# Patient Record
Sex: Male | Born: 1995 | Hispanic: Yes | Marital: Single | Smoking: Never smoker
Health system: Southern US, Community
[De-identification: ages and names within clinical notes are randomized; demographics above are authoritative.]

---

## 2017-07-24 ENCOUNTER — Emergency Department (HOSPITAL_COMMUNITY)
Admission: EM | Admit: 2017-07-24 | Discharge: 2017-07-24 | Disposition: A | Discharge status: Home / Self Care | Payer: Worker's Compensation | Attending: Emergency Medicine | Admitting: Emergency Medicine

## 2017-07-24 ENCOUNTER — Encounter (HOSPITAL_COMMUNITY): Payer: Self-pay | Admitting: Emergency Medicine

## 2017-07-24 DIAGNOSIS — Y9289 Other specified places as the place of occurrence of the external cause: Secondary | ICD-10-CM | POA: Insufficient documentation

## 2017-07-24 DIAGNOSIS — Y990 Civilian activity done for income or pay: Secondary | ICD-10-CM | POA: Insufficient documentation

## 2017-07-24 DIAGNOSIS — W291XXA Contact with electric knife, initial encounter: Secondary | ICD-10-CM | POA: Diagnosis not present

## 2017-07-24 DIAGNOSIS — Z23 Encounter for immunization: Secondary | ICD-10-CM | POA: Diagnosis not present

## 2017-07-24 DIAGNOSIS — S61011A Laceration without foreign body of right thumb without damage to nail, initial encounter: Secondary | ICD-10-CM

## 2017-07-24 DIAGNOSIS — Y9389 Activity, other specified: Secondary | ICD-10-CM | POA: Diagnosis not present

## 2017-07-24 MED ORDER — LIDOCAINE HCL (PF) 1 % IJ SOLN
5.0000 mL | Freq: Once | INTRAMUSCULAR | Status: AC
  Administered 2017-07-24: 5 mL
  Filled 2017-07-24: qty 30

## 2017-07-24 MED ORDER — TETANUS-DIPHTH-ACELL PERTUSSIS 5-2.5-18.5 LF-MCG/0.5 IM SUSP
0.5000 mL | Freq: Once | INTRAMUSCULAR | Status: AC
  Administered 2017-07-24: 0.5 mL via INTRAMUSCULAR
  Filled 2017-07-24: qty 0.5

## 2017-07-24 NOTE — ED Notes (Signed)
Patient verbalized understanding of discharge instructions, no questions. Patient ambulated out of ED with steady gait in no distress.  

## 2017-07-24 NOTE — ED Provider Notes (Signed)
[facility]-EMERGENCY DEPT Provider Note   CSN: [id number] Arrival date & time: 07/24/17  1829     History   Chief Complaint Chief Complaint  Patient presents with  . Laceration    HPI Dylan Cuevas is a 22 y.o. male who presents to the ED for a laceration. 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 Laceration to right thumb. Bleeding controlled. Unknown last tetanus.   HPI  History reviewed. No pertinent past medical history.  There are no active problems to display for this patient.   History reviewed. No pertinent surgical history.      Home Medications    Prior to Admission medications   Not on File    Family History No family history on file.  Social History Social History   Tobacco Use  . Smoking status: Not on file  Substance Use Topics  . Alcohol use: Not on file  . Drug use: Not on file     Allergies   Amoxicillin   Review of Systems Review of Systems  Skin: Positive for wound.  All other systems reviewed and are negative.    Physical Exam Updated Vital Signs BP (!) 164/104 (BP Location: Right Arm)   Pulse 79   Temp 99.7 F (37.6 C) (Oral)   Resp 20   SpO2 98%   Physical Exam  Constitutional: He appears well-developed and well-nourished. No distress.  HENT:  Head: Normocephalic.  Eyes: EOM are normal.  Neck: Neck supple.  Cardiovascular: Normal rate.  Pulmonary/Chest: Effort normal.  Musculoskeletal:       Right hand: He exhibits laceration.       Hands: 3 cm Flap laceration to the distal aspect of the right thumb.  Neurological: He is alert.  Skin: Skin is warm and dry.  Psychiatric: He has a normal mood and affect.  Nursing note and vitals reviewed.    ED Treatments / Results  Labs (all labs ordered are listed, but only abnormal results are displayed) Labs Reviewed - No data to display  Radiology No results found.  Procedures .Marland Kitchen.Laceration Repair Date/Time: 07/24/2017  7:50 PM Performed by: Janne NapoleonNeese, Nealie Mchatton M, NP Authorized by: Janne NapoleonNeese, Casmir Auguste M, NP   Consent:    Consent obtained:  Verbal   Consent given by:  Patient   Risks discussed:  Pain and poor cosmetic result Anesthesia (see MAR for exact dosages):    Anesthesia method:  Local infiltration   Local anesthetic:  Lidocaine 1% w/o epi Laceration details:    Location:  Finger   Finger location:  R index finger   Length (cm):  3 Repair type:    Repair type:  Simple Pre-procedure details:    Preparation:  Patient was prepped and draped in usual sterile fashion and imaging obtained to evaluate for foreign bodies Exploration:    Hemostasis achieved with:  Direct pressure   Wound exploration: wound explored through full range of motion     Contaminated: no   Treatment:    Area cleansed with:  Saline and Betadine   Irrigation solution:  Sterile saline   Irrigation method:  Syringe Skin repair:    Repair method:  Sutures   Suture size:  4-0   Suture material:  Prolene   Suture technique:  Simple interrupted   Number of sutures:  6 Approximation:    Approximation:  Close Post-procedure details:    Dressing:  Non-adherent dressing   Patient tolerance of procedure:  Tolerated well, no immediate complications Comments:  Tetanus updated   (including critical care time)  Medications Ordered in ED Medications  lidocaine (PF) (XYLOCAINE) 1 % injection 5 mL (5 mLs Infiltration Given 07/24/17 1911)  Tdap (BOOSTRIX) injection 0.5 mL (0.5 mLs Intramuscular Given 07/24/17 1909)     Initial Impression / Assessment and Plan / ED Course  I have reviewed the triage vital signs and the nursing notes. 22 y.o. male here with a flap laceration to the right thumb stable for d/c. Return precautions discussed. F/u 7 days for suture removal.  Final Clinical Impressions(s) / ED Diagnoses   Final diagnoses:  Laceration of right thumb without foreign body without damage to nail, initial encounter    ED Discharge  Orders    None       Janne Napoleon, NP 07/24/17 1953    Melene Plan, DO 07/24/17 2005

## 2017-07-24 NOTE — Discharge Instructions (Addendum)
You can go to your company doctor, Urgent Care, your private MD or here for suture removal in 7 days. Return sooner for any problems.

## 2017-07-24 NOTE — ED Triage Notes (Signed)
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 Laceration to right thumb. Bleeding controlled. Unknown last tetanus.

## 2017-07-25 ENCOUNTER — Other Ambulatory Visit: Payer: Self-pay

## 2017-07-25 ENCOUNTER — Encounter (HOSPITAL_COMMUNITY): Payer: Self-pay | Admitting: Emergency Medicine

## 2017-07-25 ENCOUNTER — Emergency Department (HOSPITAL_COMMUNITY)
Admission: EM | Admit: 2017-07-25 | Discharge: 2017-07-25 | Disposition: A | Discharge status: Home / Self Care | Payer: Worker's Compensation | Attending: Emergency Medicine | Admitting: Emergency Medicine

## 2017-07-25 DIAGNOSIS — Z0283 Encounter for blood-alcohol and blood-drug test: Secondary | ICD-10-CM

## 2017-07-25 DIAGNOSIS — X58XXXD Exposure to other specified factors, subsequent encounter: Secondary | ICD-10-CM | POA: Diagnosis not present

## 2017-07-25 DIAGNOSIS — S61011D Laceration without foreign body of right thumb without damage to nail, subsequent encounter: Secondary | ICD-10-CM | POA: Insufficient documentation

## 2017-07-25 DIAGNOSIS — Z042 Encounter for examination and observation following work accident: Secondary | ICD-10-CM | POA: Diagnosis not present

## 2017-07-25 NOTE — ED Triage Notes (Signed)
Patient seen yesterday for laceration. Requesting drug test for workers comp today.

## 2017-07-25 NOTE — Discharge Instructions (Addendum)
Your drug test was performed today. Follow up with your employer regarding this issue.  For your wound: Keep wound clean with mild soap and water. Keep area covered with a topical antibiotic ointment and bandage, keep bandage dry, and do not submerge in water for 24 hours. Ice and elevate for additional pain and swelling relief. Alternate between Ibuprofen and Tylenol for additional pain relief. Follow up with your primary care doctor or the [facility] in approximately 6-9 days for wound recheck and suture removal. Monitor area for signs of infection to include, but not limited to: increasing pain, spreading redness, drainage/pus, worsening swelling, or fevers. Return to emergency department for emergent changing or worsening symptoms.

## 2017-07-25 NOTE — ED Provider Notes (Signed)
[facility]-EMERGENCY DEPT Provider Note   CSN: [id number] Arrival date & time: 07/25/17  1544     History   Chief Complaint No chief complaint on file.   HPI Syair Fricker is a 22 y.o. male who presents to the ED for drug testing.  Chart review reveals that he was seen here yesterday for a R thumb laceration, which was sutured.  He states that he forgot to have his drug screen done yesterday, so he was sent back today to have this done.  He comes with paperwork regarding this drug screen.  He denies any other complaints or concerns at this time, denies any redness, warmth, or drainage to his finger laceration, or any other complaints at this time.  The history is provided by the patient and medical records. No language interpreter was used.    History reviewed. No pertinent past medical history.  There are no active problems to display for this patient.   History reviewed. No pertinent surgical history.      Home Medications    Prior to Admission medications   Not on File    Family History No family history on file.  Social History Social History   Tobacco Use  . Smoking status: Not on file  Substance Use Topics  . Alcohol use: Not on file  . Drug use: Not on file     Allergies   Amoxicillin   Review of Systems Review of Systems  Skin: Positive for wound (R thumb lac from yesterday). Negative for color change.  Allergic/Immunologic: Negative for immunocompromised state.     Physical Exam Updated Vital Signs BP (!) 168/89   Pulse 96   Temp 98.6 F (37 C) (Oral)   Resp 15   SpO2 96%   Physical Exam  Constitutional: He is oriented to person, place, and time. Vital signs are normal. He appears well-developed and well-nourished.  Non-toxic appearance. No distress.  Afebrile, nontoxic, NAD  HENT:  Head: Normocephalic and atraumatic.  Mouth/Throat: Mucous membranes are normal.  Eyes: Conjunctivae and EOM are normal. Right eye  exhibits no discharge. Left eye exhibits no discharge.  Neck: Normal range of motion. Neck supple.  Cardiovascular: Normal rate and intact distal pulses.  Pulmonary/Chest: Effort normal. No respiratory distress.  Abdominal: Normal appearance. He exhibits no distension.  Musculoskeletal: Normal range of motion.  Neurological: He is alert and oriented to person, place, and time. He has normal strength. No sensory deficit.  Skin: Skin is warm, dry and intact. No rash noted.  R thumb wrapped, dressing c/d/i without any erythema around the dressing, no drainage or bleeding noted through the dressing  Psychiatric: He has a normal mood and affect.  Nursing note and vitals reviewed.    ED Treatments / Results  Labs (all labs ordered are listed, but only abnormal results are displayed) Labs Reviewed - No data to display  EKG None  Radiology No results found.  Procedures Procedures (including critical care time)  Medications Ordered in ED Medications - No data to display   Initial Impression / Assessment and Plan / ED Course  I have reviewed the triage vital signs and the nursing notes.  Pertinent labs & imaging results that were available during my care of the patient were reviewed by me and considered in my medical decision making (see chart for details).     22 y.o. male here for drug test after yesterday's visit. Was here for R thumb lac yesterday, forgot to get his  drug screen. Came back today with the paperwork. Drug screen completed here today. He has no other complaints or concerns. R thumb wrapped, no drainage or bleeding through the dressing, he denies any concerns at this time. Stable for d/c. Advised f/up with his employer regarding drug testing, and with his PCP in 6-9 days for wound check and suture removal. I explained the diagnosis and have given explicit precautions to return to the ER including for any other new or worsening symptoms. The patient understands and accepts  the medical plan as it's been dictated and I have answered their questions. Discharge instructions concerning home care and prescriptions have been given. The patient is STABLE and is discharged to home in good condition.    Final Clinical Impressions(s) / ED Diagnoses   Final diagnoses:  Employment-related drug testing, encounter for    ED Discharge Orders    [address]       Cynthis Purington, AmbroseMercedes, [place] JerseyPA-C 07/25/17 1626    Lorre NickAllen, Anthony, MD 07/27/17 [phone]

## 2017-08-07 ENCOUNTER — Emergency Department (HOSPITAL_COMMUNITY)
Admission: EM | Admit: 2017-08-07 | Discharge: 2017-08-07 | Disposition: A | Discharge status: Home / Self Care | Payer: Worker's Compensation | Attending: Emergency Medicine | Admitting: Emergency Medicine

## 2017-08-07 ENCOUNTER — Encounter (HOSPITAL_COMMUNITY): Payer: Self-pay | Admitting: Emergency Medicine

## 2017-08-07 DIAGNOSIS — Z4802 Encounter for removal of sutures: Secondary | ICD-10-CM | POA: Insufficient documentation

## 2017-08-07 NOTE — ED Triage Notes (Signed)
Patient here from home requesting suture removal from right thumb.

## 2017-08-07 NOTE — ED Provider Notes (Signed)
[facility]-EMERGENCY DEPT Provider Note   CSN: [id number] Arrival date & time: 08/07/17  1021     History   Chief Complaint Chief Complaint  Patient presents with  . Suture / Staple Removal    HPI Dylan Cuevas is a 22 y.o. male.  HPI Patient presents to the emergency department for suture removal.  Patient states he had sutures placed 12 days ago after an injury that occurred while at work.  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  The patient states that he has had no complications from the area.  Patient states that he is kept the area clean and dry. History reviewed. No pertinent past medical history.  There are no active problems to display for this patient.   History reviewed. No pertinent surgical history.      Home Medications    Prior to Admission medications   Medication Sig Start Date End Date Taking? Authorizing Provider  ibuprofen (ADVIL,MOTRIN) 200 MG tablet Take 400 mg by mouth every 6 (six) hours as needed for moderate pain.    [provider]    Family History No family history on file.  Social History Social History   Tobacco Use  . Smoking status: Never Smoker  Substance Use Topics  . Alcohol use: Never    Frequency: Never  . Drug use: Never     Allergies   Amoxicillin   Review of Systems Review of Systems  All other systems negative except as documented in the HPI. All pertinent positives and negatives as reviewed in the HPI. Physical Exam Updated Vital Signs BP (!) 159/109 (BP Location: Left Arm)   Pulse 90   Temp 98.5 F (36.9 C) (Oral)   Resp 18   SpO2 100%   Physical Exam  Constitutional: He is oriented to person, place, and time. He appears well-developed and well-nourished. No distress.  HENT:  Head: Normocephalic and atraumatic.  Eyes: Pupils are equal, round, and reactive to light.  Pulmonary/Chest: Effort normal.  Musculoskeletal:        Hands: Neurological: He is alert and oriented to person, place, and time.  Skin: Skin is warm and dry.  Psychiatric: He has a normal mood and affect.  Nursing note and vitals reviewed.    ED Treatments / Results  Labs (all labs ordered are listed, but only abnormal results are displayed) Labs Reviewed - No data to display  EKG None  Radiology No results found.  Procedures Procedures (including critical care time)  Medications Ordered in ED Medications - No data to display   Initial Impression / Assessment and Plan / ED Course  I have reviewed the triage vital signs and the nursing notes.  Pertinent labs & imaging results that were available during my care of the patient were reviewed by me and considered in my medical decision making (see chart for details).     SUTURE REMOVAL Performed by: Jamesetta Orleans Yunique Dearcos  Consent: Verbal consent obtained. Patient identity confirmed: provided demographic data Time out: Immediately prior to procedure a "time out" was called to verify the correct patient, procedure, equipment, support staff and site/side marked as required.  Location details: Distal right thumb  Wound Appearance: clean  Sutures/Staples Removed: 6 Facility: sutures placed in this facility Patient tolerance: Patient tolerated the procedure well with no immediate complications.   Patient is advised to return here as needed.  Patient agrees questions were answered.  Final Clinical Impressions(s) / ED Diagnoses  Final diagnoses:  None    ED Discharge Orders    None       Charlestine NightLawyer, Tobin Cadiente, Cordelia Poche-C 08/07/17 1103    Gerhard MunchLockwood, Robert, MD 08/07/17 1526

## 2017-08-07 NOTE — Discharge Instructions (Addendum)
Return here as needed. Keep the area clean and dry °

## 2017-08-07 NOTE — ED Notes (Signed)
Bed: [facility] Expected date:  Expected time:  Means of arrival:  Comments: 

## 2017-08-07 NOTE — ED Notes (Signed)
Patient verbalized understanding of discharge instructions, no questions. Patient ambulated out of ED with steady gait in no distress.  

## 2019-10-01 ENCOUNTER — Emergency Department (HOSPITAL_COMMUNITY): Payer: Commercial Managed Care - PPO

## 2019-10-01 ENCOUNTER — Encounter (HOSPITAL_COMMUNITY): Payer: Self-pay

## 2019-10-01 ENCOUNTER — Inpatient Hospital Stay (HOSPITAL_COMMUNITY)
Admission: EM | Admit: 2019-10-01 | Discharge: 2019-10-02 | DRG: 153 | Disposition: A | Discharge status: Home / Self Care | Payer: Commercial Managed Care - PPO | Attending: Family Medicine | Admitting: Family Medicine

## 2019-10-01 ENCOUNTER — Other Ambulatory Visit: Payer: Self-pay

## 2019-10-01 DIAGNOSIS — Z20822 Contact with and (suspected) exposure to covid-19: Secondary | ICD-10-CM | POA: Diagnosis present

## 2019-10-01 DIAGNOSIS — J020 Streptococcal pharyngitis: Secondary | ICD-10-CM

## 2019-10-01 DIAGNOSIS — J36 Peritonsillar abscess: Principal | ICD-10-CM | POA: Diagnosis present

## 2019-10-01 DIAGNOSIS — Z880 Allergy status to penicillin: Secondary | ICD-10-CM | POA: Diagnosis not present

## 2019-10-01 DIAGNOSIS — Z6832 Body mass index (BMI) 32.0-32.9, adult: Secondary | ICD-10-CM

## 2019-10-01 DIAGNOSIS — E669 Obesity, unspecified: Secondary | ICD-10-CM | POA: Diagnosis present

## 2019-10-01 DIAGNOSIS — B955 Unspecified streptococcus as the cause of diseases classified elsewhere: Secondary | ICD-10-CM | POA: Diagnosis present

## 2019-10-01 DIAGNOSIS — J029 Acute pharyngitis, unspecified: Secondary | ICD-10-CM | POA: Diagnosis not present

## 2019-10-01 LAB — CBC
HCT: 43.1 % (ref 39.0–52.0)
Hemoglobin: 14.3 g/dL (ref 13.0–17.0)
MCH: 31.8 pg (ref 26.0–34.0)
MCHC: 33.2 g/dL (ref 30.0–36.0)
MCV: 96 fL (ref 80.0–100.0)
Platelets: 260 10*3/uL (ref 150–400)
RBC: 4.49 MIL/uL (ref 4.22–5.81)
RDW: 13 % (ref 11.5–15.5)
WBC: 20.4 10*3/uL — ABNORMAL HIGH (ref 4.0–10.5)
nRBC: 0 % (ref 0.0–0.2)

## 2019-10-01 LAB — BASIC METABOLIC PANEL
Anion gap: 13 (ref 5–15)
BUN: 8 mg/dL (ref 6–20)
CO2: 27 mmol/L (ref 22–32)
Calcium: 9.5 mg/dL (ref 8.9–10.3)
Chloride: 102 mmol/L (ref 98–111)
Creatinine, Ser: 0.9 mg/dL (ref 0.61–1.24)
GFR calc Af Amer: >60 mL/min (ref >60)
GFR calc non Af Amer: >60 mL/min (ref >60)
Glucose, Bld: 104 mg/dL — ABNORMAL HIGH (ref 70–99)
Potassium: 3.7 mmol/L (ref 3.5–5.1)
Sodium: 142 mmol/L (ref 135–145)

## 2019-10-01 LAB — SARS CORONAVIRUS 2 BY RT PCR (HOSPITAL ORDER, PERFORMED IN ~~LOC~~ HOSPITAL LAB): SARS Coronavirus 2: NEGATIVE

## 2019-10-01 MED ORDER — DEXAMETHASONE SODIUM PHOSPHATE 10 MG/ML IJ SOLN
10.0000 mg | Freq: Once | INTRAMUSCULAR | Status: AC
  Administered 2019-10-01: 10 mg via INTRAVENOUS
  Filled 2019-10-01: qty 1

## 2019-10-01 MED ORDER — FENTANYL CITRATE (PF) 100 MCG/2ML IJ SOLN
50.0000 ug | Freq: Once | INTRAMUSCULAR | Status: AC
  Administered 2019-10-01: 50 ug via INTRAVENOUS
  Filled 2019-10-01: qty 2

## 2019-10-01 MED ORDER — ACETAMINOPHEN 325 MG PO TABS
650.0000 mg | ORAL_TABLET | Freq: Four times a day (QID) | ORAL | Status: DC | PRN
  Administered 2019-10-01 – 2019-10-02 (×2): 650 mg via ORAL
  Filled 2019-10-01 (×2): qty 2

## 2019-10-01 MED ORDER — LACTATED RINGERS IV SOLN: INTRAVENOUS | Status: DC

## 2019-10-01 MED ORDER — CLINDAMYCIN PHOSPHATE 600 MG/50ML IV SOLN
600.0000 mg | Freq: Once | INTRAVENOUS | Status: AC
  Administered 2019-10-01: 600 mg via INTRAVENOUS
  Filled 2019-10-01: qty 50

## 2019-10-01 MED ORDER — ENOXAPARIN SODIUM 40 MG/0.4ML ~~LOC~~ SOLN
40.0000 mg | SUBCUTANEOUS | Status: DC
  Administered 2019-10-01: 40 mg via SUBCUTANEOUS
  Filled 2019-10-01: qty 0.4

## 2019-10-01 MED ORDER — ONDANSETRON HCL 4 MG/2ML IJ SOLN
4.0000 mg | Freq: Once | INTRAMUSCULAR | Status: AC
  Administered 2019-10-01: 4 mg via INTRAVENOUS
  Filled 2019-10-01: qty 2

## 2019-10-01 MED ORDER — KETOROLAC TROMETHAMINE 30 MG/ML IJ SOLN: 30.0000 mg | Freq: Four times a day (QID) | INTRAMUSCULAR | Status: DC | PRN

## 2019-10-01 MED ORDER — IOHEXOL 300 MG/ML  SOLN
75.0000 mL | Freq: Once | INTRAMUSCULAR | Status: AC | PRN
  Administered 2019-10-01: 75 mL via INTRAVENOUS

## 2019-10-01 MED ORDER — FENTANYL CITRATE (PF) 100 MCG/2ML IJ SOLN
25.0000 ug | Freq: Once | INTRAMUSCULAR | Status: AC
  Administered 2019-10-01: 25 ug via INTRAVENOUS
  Filled 2019-10-01: qty 2

## 2019-10-01 MED ORDER — CLINDAMYCIN PHOSPHATE 600 MG/50ML IV SOLN
600.0000 mg | Freq: Four times a day (QID) | INTRAVENOUS | Status: DC
  Administered 2019-10-01 – 2019-10-02 (×3): 600 mg via INTRAVENOUS
  Filled 2019-10-01 (×3): qty 50

## 2019-10-01 MED ORDER — LACTATED RINGERS IV BOLUS
1000.0000 mL | Freq: Once | INTRAVENOUS | Status: AC
  Administered 2019-10-01: 1000 mL via INTRAVENOUS

## 2019-10-01 MED ORDER — ACETAMINOPHEN 650 MG RE SUPP: 650.0000 mg | Freq: Four times a day (QID) | RECTAL | Status: DC | PRN

## 2019-10-01 NOTE — ED Provider Notes (Signed)
[facility] EMERGENCY DEPARTMENT Provider Note   CSN: [id number] Arrival date & time: 10/01/19  1122     History Chief Complaint  Patient presents with  . Abscess    Dylan Cuevas is a 24 y.o. male.  Patient is a 24 year old male with no significant past medical history who is presenting today with worsening sore throat for the last 7 days.  He reports he started on a Z-Pak 4 days ago but the pain has only worsened.  He now has severe pain in the left side of his throat and he has not been able to swallow since yesterday he has been spitting his saliva in a bag.  When he speaks his voice is muffled and when he lays flat he feels slightly short of breath but denies any shortness of breath if he is sitting upright.  For the last 7 days he is also had fever but denies any cough or congestion.  He has had strep throat before but has never had a peritonsillar abscess.  He is allergic to penicillin but no other drug allergies.  He went to urgent care prior to coming here and after evaluating his throat they sent him here for further care.  He reports he last had something to drink yesterday but has drink nothing today.  The history is provided by the patient.       History reviewed. No pertinent past medical history.  There are no problems to display for this patient.   History reviewed. No pertinent surgical history.     No family history on file.  Social History   Tobacco Use  . Smoking status: Never Smoker  Substance Use Topics  . Alcohol use: Never  . Drug use: Never    Home Medications Prior to Admission medications   Medication Sig Start Date End Date Taking? Authorizing Provider  ibuprofen (ADVIL,MOTRIN) 200 MG tablet Take 400 mg by mouth every 6 (six) hours as needed for moderate pain.    [provider]    Allergies    Amoxicillin  Review of Systems   Review of Systems  All other systems reviewed and are negative.   Physical  Exam Updated Vital Signs BP (!) 152/101   Pulse 90   Temp (!) 101.5 F (38.6 C)   Resp (!) 22   Ht 6\' 1"  (1.854 m)   Wt 111.1 kg   SpO2 98%   BMI 32.32 kg/m   Physical Exam Vitals and nursing note reviewed.  Constitutional:      General: He is not in acute distress.    Appearance: He is well-developed.  HENT:     Head: Normocephalic and atraumatic.     Nose: Nose normal.     Mouth/Throat:     Mouth: Mucous membranes are dry.   Eyes:     Conjunctiva/sclera: Conjunctivae normal.     Pupils: Pupils are equal, round, and reactive to light.  Neck:     Comments: No stridor and no significant swelling of the neck.  Tender cervical adenopathy Cardiovascular:     Rate and Rhythm: Normal rate.  Pulmonary:     Effort: Pulmonary effort is normal. No respiratory distress.  Musculoskeletal:        General: No tenderness. Normal range of motion.     Cervical back: Normal range of motion and neck supple.  Lymphadenopathy:     Cervical: Cervical adenopathy present.  Skin:    General: Skin is warm and dry.  Findings: No erythema or rash.  Neurological:     General: No focal deficit present.     Mental Status: He is alert and oriented to person, place, and time. Mental status is at baseline.  Psychiatric:        Mood and Affect: Mood normal.        Behavior: Behavior normal.        Thought Content: Thought content normal.     ED Results / Procedures / Treatments   Labs (all labs ordered are listed, but only abnormal results are displayed) Labs Reviewed  CBC - Abnormal; Notable for the following components:      Result Value   WBC 20.4 (*)    All other components within normal limits  BASIC METABOLIC PANEL - Abnormal; Notable for the following components:   Glucose, Bld 104 (*)    All other components within normal limits  SARS CORONAVIRUS 2 BY RT PCR (HOSPITAL ORDER, PERFORMED IN [facility])    EKG None  Radiology No results  found.  Procedures Procedures (including critical care time)  Medications Ordered in ED Medications  fentaNYL (SUBLIMAZE) injection 50 mcg (has no administration in time range)  ondansetron (ZOFRAN) injection 4 mg (has no administration in time range)  lactated ringers bolus 1,000 mL (has no administration in time range)  dexamethasone (DECADRON) injection 10 mg (has no administration in time range)  clindamycin (CLEOCIN) IVPB 600 mg (has no administration in time range)    ED Course  I have reviewed the triage vital signs and the nursing notes.  Pertinent labs & imaging results that were available during my care of the patient were reviewed by me and considered in my medical decision making (see chart for details).    MDM Rules/Calculators/A&P                          Healthy 24 year old male who tested positive for strep today who has been on azithromycin for the last 4 days presenting with worsening sore throat and hot potato voice.  Concern for peritonsillar abscess with significant edema in the peritonsillar area on the left with mild uvular deviation.  Patient also has generalized exudates over the tonsils.  He has no stridor and does not appear to have any trouble breathing.  He is febrile here and has a white count of 20,000.  He has not received his Covid vaccine.  Patient given IV fluids, Decadron, clindamycin.  Will Covid swab and talk with ENT for further care.  4:56 PM Spoke with Dr. Pollyann Kennedy who recommends CT as it is unusual to see exudate with PTA.  6:58 PM CT shows bulky acute tonsillitis with suppuration of both palatine tonsils worse on the left with a nearly 4 cm developing left tonsillar abscess.  Also associated inflammation in both parapharyngeal spaces and left submandibular space with trace retropharyngeal effusion.  On repeat evaluation patient is still unable to tolerate his saliva with still significant muffled voice.  Will have ENT evaluate.  7:18 PM Dr.  Pollyann Kennedy evaluated the CT and feels that this will improve with abx.  However because the pt can't tolerate secretions will admit for further care.   Final Clinical Impression(s) / ED Diagnoses Final diagnoses:  Tonsillar abscess  Strep pharyngitis    Rx / DC Orders ED Discharge Orders    None       Gwyneth Sprout, MD 10/01/19 1919

## 2019-10-01 NOTE — ED Triage Notes (Signed)
Pt arrives to ED w/ c/o strep throat, + test at [facility]. Pt states he has developed an abscess in his throat that is now causing difficult breathing. Pt states symptoms started 1 week ago. Pt difficulty speaking in triage.

## 2019-10-01 NOTE — H&P (Addendum)
Family Medicine Teaching [facility] Admission History and Physical Service Pager: [phone]  Patient name: Dylan Cuevas Medical record number: [id number] Date of birth: 03-29-1995 Age: 24 y.o. Gender: male  Primary Care Provider: Patient, No Pcp Per Consultants: ENT Code Status: Full  Chief Complaint: Worsening sore throat  Assessment and Plan: Dylan Cuevas is a 24 y.o. male presenting with worsening sore throat and inability to swallow saliva . PMH is significant for nothing.  Tonsillar Abscess Due to Strep pharyngitis: Has been worsening for the past week. Was started on Z-Pak 4 days ago but worsened. Yesterday was unable to swallow saliva. Temp at admission was 101.5. 2.5 finger breadth trismus w/ hot potato voice on physical exam. WBC is 20.4. CT was ordered and revealed bulky Acute Tonsillitis with suppuration of both palatine tonsils - worse on the left with a nearly 4 cm developing left tonsillar abscess. Trace retropharyngeal effusion. Reactive retropharyngeal and bilateral level 2 lymph nodes. Was given a dose of IV Clindamycin 600 mg, placed NPO and started on mIVF. ENT was consulted in the ED. Recommended to continue with IV Clindamycin. No surgical intervention planned at this time. Airway is currently patent, will closely monitor for any changes. Bilateral exudate and erythema present. Emphasized importance of contacting us if breathing becomes more difficult at any time.  -Admit to Med-Surg, attending Dr. Pollie Meyer -Appreciate continued ENT recommendations -Continue IV Clindamycin 600 mg q6 hours -Monitor for any changes in airway patency  -Monitor temp for recurrence of fever -Trend BMP and CBC    FEN/GI: NPO Prophylaxis: Lovenox  Disposition: Med-Surg  History of Present Illness:  Dylan Cuevas is a 23 y.o. male presenting with worsening sore throat and inability to swallow saliva. States it started 7 days ago and continually worsened and had a fever the entire  time. Started on a Z-Pak 4 days ago with no improvement but had worsening of symptoms. Reports mild SOB when laying down flat but otherwise has no SOB. Reports history of Strep throat but never had any abscesses. He went to an Urgent Care this morning but after evaluation they sent him to the ED. He reports that he was last able to drink yesterday but has not been able to drink since.  Review Of Systems: Per HPI with the following additions:   Review of Systems  Constitutional: Positive for fever. Negative for chills and diaphoresis.  HENT: Negative.   Respiratory: Negative for shortness of breath and wheezing.   Cardiovascular: Negative for chest pain and palpitations.  Gastrointestinal: Negative for abdominal pain.  Genitourinary: Negative.   Musculoskeletal: Negative.   Skin: Negative.  Negative for rash.  Neurological: Negative.   Endo/Heme/Allergies: Negative.   Psychiatric/Behavioral: Negative.     Patient Active Problem List   Diagnosis Date Noted  . Tonsillar abscess 10/01/2019    Past Medical History: History reviewed. No pertinent past medical history.  Past Surgical History: History reviewed. No pertinent surgical history.  Social History: Social History   Tobacco Use  . Smoking status: Never Smoker  Substance Use Topics  . Alcohol use: Never  . Drug use: Never   Additional social history:  Please also refer to relevant sections of EMR.  Family History: No family history on file. Mother- Healthy Father- Healthy Brother- Diabetes 2 Sisters- Healthy  Allergies and Medications: Allergies  Allergen Reactions  . Amoxicillin     Has patient had a PCN reaction causing immediate rash, facial/tongue/throat swelling, SOB or lightheadedness with hypotension: No Has patient had a PCN  reaction causing severe rash involving mucus membranes or skin necrosis: No Has patient had a PCN reaction that required hospitalization: Yes Has patient had a PCN reaction occurring  within the last 10 years: No If all of the above answers are "NO", then may proceed with Cephalosporin use.    No current facility-administered medications on file prior to encounter.   Current Outpatient Medications on File Prior to Encounter  Medication Sig Dispense Refill  . ibuprofen (ADVIL,MOTRIN) 200 MG tablet Take 400 mg by mouth every 6 (six) hours as needed for moderate pain.      Objective: BP (!) 149/92 (BP Location: Right Arm)   Pulse 70   Temp 99.6 F (37.6 C) (Oral)   Resp 17   Ht 6\' 1"  (1.854 m)   Wt 111.1 kg   SpO2 98%   BMI 32.32 kg/m  Exam: Physical Exam Constitutional:      General: He is not in acute distress.    Appearance: He is obese. He is not ill-appearing or toxic-appearing.  HENT:     Nose: Nose normal.     Mouth/Throat:     Mouth: Mucous membranes are moist.     Pharynx: Pharyngeal swelling and posterior oropharyngeal erythema present.     Tonsils: Tonsillar exudate (Bilateral Left worse than Right) and tonsillar abscess present.   Eyes:     Extraocular Movements: Extraocular movements intact.     Pupils: Pupils are equal, round, and reactive to light.  Cardiovascular:     Rate and Rhythm: Normal rate and regular rhythm.     Pulses: Normal pulses.          Radial pulses are 2+ on the right side and 2+ on the left side.       Dorsalis pedis pulses are 2+ on the right side and 2+ on the left side.     Heart sounds: Normal heart sounds, S1 normal and S2 normal. No murmur heard.   Pulmonary:     Effort: Pulmonary effort is normal. No respiratory distress.     Breath sounds: Normal breath sounds. No wheezing.  Abdominal:     Palpations: Abdomen is soft. There is no mass.     Tenderness: There is no abdominal tenderness.  Musculoskeletal:     Right lower leg: No edema.     Left lower leg: No edema.  Lymphadenopathy:     Cervical: Cervical adenopathy present.  Skin:    General: Skin is warm.     Findings: No rash.  Neurological:      Mental Status: He is alert. Mental status is at baseline.  Psychiatric:        Mood and Affect: Mood normal.        Behavior: Behavior normal.        Thought Content: Thought content normal.     Labs and Imaging: CBC BMET  Recent Labs  Lab 10/01/19 1222  WBC 20.4*  HGB 14.3  HCT 43.1  PLT 260   Recent Labs  Lab 10/01/19 1222  NA 142  K 3.7  CL 102  CO2 27  BUN 8  CREATININE 0.90  GLUCOSE 104*  CALCIUM 9.5      CT Soft Tissue Neck W Contrast: Bulky Acute Tonsillitis with suppuration of both palatine tonsils - worse on the left with a nearly 4 cm developing left tonsillar abscess. Associated inflammation in both parapharyngeal spaces and the left submandibular space. Trace retropharyngeal effusion. Reactive retropharyngeal and bilateral level 2 lymph nodes  Lauro Franklin, MD 10/01/2019, 8:07 PM PGY-1, [facility] Family Medicine FPTS Intern pager: [phone], text pages welcome  FPTS Upper-Level Resident Addendum I have independently interviewed and examined the patient. I have discussed the above with the original author and agree with their documentation. My edits for correction/addition/clarification are in - purple. Please see also any attending notes.  FPTS Service pager: [phone] (text pages welcome through [employer])  Melene Plan, M.D.  PGY-3 10/02/2019 6:31 AM

## 2019-10-02 ENCOUNTER — Encounter (HOSPITAL_COMMUNITY): Payer: Self-pay | Admitting: Student in an Organized Health Care Education/Training Program

## 2019-10-02 DIAGNOSIS — J020 Streptococcal pharyngitis: Secondary | ICD-10-CM

## 2019-10-02 LAB — HIV ANTIBODY (ROUTINE TESTING W REFLEX): HIV Screen 4th Generation wRfx: NONREACTIVE

## 2019-10-02 LAB — BASIC METABOLIC PANEL
Anion gap: 11 (ref 5–15)
BUN: 10 mg/dL (ref 6–20)
CO2: 28 mmol/L (ref 22–32)
Calcium: 9.6 mg/dL (ref 8.9–10.3)
Chloride: 101 mmol/L (ref 98–111)
Creatinine, Ser: 0.71 mg/dL (ref 0.61–1.24)
GFR calc Af Amer: >60 mL/min (ref >60)
GFR calc non Af Amer: >60 mL/min (ref >60)
Glucose, Bld: 117 mg/dL — ABNORMAL HIGH (ref 70–99)
Potassium: 3.9 mmol/L (ref 3.5–5.1)
Sodium: 140 mmol/L (ref 135–145)

## 2019-10-02 LAB — CBC WITH DIFFERENTIAL/PLATELET
Abs Immature Granulocytes: 0.12 10*3/uL — ABNORMAL HIGH (ref 0.00–0.07)
Basophils Absolute: 0 10*3/uL (ref 0.0–0.1)
Basophils Relative: 0 %
Eosinophils Absolute: 0 10*3/uL (ref 0.0–0.5)
Eosinophils Relative: 0 %
HCT: 42.3 % (ref 39.0–52.0)
Hemoglobin: 14.2 g/dL (ref 13.0–17.0)
Immature Granulocytes: 1 %
Lymphocytes Relative: 6 %
Lymphs Abs: 1.3 10*3/uL (ref 0.7–4.0)
MCH: 32 pg (ref 26.0–34.0)
MCHC: 33.6 g/dL (ref 30.0–36.0)
MCV: 95.3 fL (ref 80.0–100.0)
Monocytes Absolute: 1.4 10*3/uL — ABNORMAL HIGH (ref 0.1–1.0)
Monocytes Relative: 7 %
Neutro Abs: 19.1 10*3/uL — ABNORMAL HIGH (ref 1.7–7.7)
Neutrophils Relative %: 86 %
Platelets: 265 10*3/uL (ref 150–400)
RBC: 4.44 MIL/uL (ref 4.22–5.81)
RDW: 13 % (ref 11.5–15.5)
WBC: 22.1 10*3/uL — ABNORMAL HIGH (ref 4.0–10.5)
nRBC: 0 % (ref 0.0–0.2)

## 2019-10-02 MED ORDER — ACETAMINOPHEN 325 MG PO TABS: 650.0000 mg | ORAL_TABLET | Freq: Four times a day (QID) | ORAL | Status: AC | PRN

## 2019-10-02 MED ORDER — CLINDAMYCIN HCL 300 MG PO CAPS
300.0000 mg | ORAL_CAPSULE | Freq: Once | ORAL | Status: AC
  Administered 2019-10-02: 300 mg via ORAL
  Filled 2019-10-02: qty 1

## 2019-10-02 MED ORDER — CLINDAMYCIN HCL 300 MG PO CAPS: 300.0000 mg | ORAL_CAPSULE | Freq: Four times a day (QID) | ORAL | 0 refills | Status: AC

## 2019-10-02 MED FILL — CLINDAMYCIN HCL 300 MG CAPS: 300 | 13 days supply | Qty: 52 | Fill #0

## 2019-10-02 NOTE — Progress Notes (Addendum)
Family Medicine Teaching Service Daily Progress Note Intern Pager: [phone]  Patient name: Dylan Cuevas Medical record number: [id number] Date of birth: 1995/12/29 Age: 24 y.o. Gender: male  Primary Care Provider: Patient, No Pcp Per Consultants: ENT Code Status: FULL  Pt Overview and Major Events to Date:  9/9: admitted, started IV Clindamycin  Assessment and Plan:  Dylan Cuevas is a 24 y.o. male presenting with worsening sore throat and inability to swallow saliva. No PMH.  Tonsillar Abscess Due to Strep Pharyngitis Improving. Patient with improved ability to swallow secretions and denies SOB or significant throat pain. Still unable to tolerate PO. Afebrile this morning with WBC 22.1 today (from 20.4) with neutrophilia to 19. -Appreciate ENT recommendations -IV Clindamycin 600mg  q6h -mIVF with LR @ 180mL/hr -IV Toradol 30mg  q6h prn for severe pain -Trend BMP, CBC -Monitor fever curve   FEN/GI: Full liquid diet PPx: Lovenox   Status is: Inpatient Remains inpatient appropriate because:IV treatments appropriate due to intensity of illness or inability to take PO  Dispo: The patient is from: Home              Anticipated d/c is to: Home              Anticipated d/c date is: 3 days              Patient currently is not medically stable to d/c.   Subjective:  Patient states he's feeling better than when he came in to the hospital. Denies SOB or significant throat pain. Feels he's able to swallow his own secretions, but still spitting up some purulent/pink tinged mucous.   Objective: Temp:  [97.7 F (36.5 C)-101.5 F (38.6 C)] 97.7 F (36.5 C) (09/10 0614) Pulse Rate:  [70-90] 80 (09/10 0614) Resp:  [16-22] 16 (09/10 0614) BP: (129-162)/(79-101) 136/79 (09/10 0614) SpO2:  [97 %-99 %] 99 % (09/10 0614) Weight:  [111.1 kg] 111.1 kg (09/09 1133) Physical Exam: General: alert, well-appearing, NAD HEENT: oropharynx with bilateral tonsillar edema (nearly kissing),  exudates and erythema. Voice muffled, slight trismus  Neck: Anterior cervical lymphadenopathy present Cardiovascular: RRR, normal S1/S2 without m/r/g Respiratory: normal WOB, lungs CTAB Abdomen: soft, nontender, nondistended Extremities: no peripheral edema  Laboratory: Recent Labs  Lab 10/01/19 1222 10/02/19 0619  WBC 20.4* 22.1*  HGB 14.3 14.2  HCT 43.1 42.3  PLT 260 265   Recent Labs  Lab 10/01/19 1222  NA 142  K 3.7  CL 102  CO2 27  BUN 8  CREATININE 0.90  CALCIUM 9.5  GLUCOSE 104*     Imaging/Diagnostic Tests: CT Soft Tissue Neck W Contrast Result Date: 10/01/2019 IMPRESSION: Bulky Acute Tonsillitis with suppuration of both palatine tonsils - worse on the left with a nearly 4 cm developing left tonsillar abscess. Associated inflammation in both parapharyngeal spaces and the left submandibular space. Trace retropharyngeal effusion. Reactive retropharyngeal and bilateral level 2 lymph nodes. Recommend ENT consultation.    12/01/19, MD 10/02/2019, 7:20 AM PGY-1, [facility] Family Medicine FPTS Intern pager: [phone], text pages welcome

## 2019-10-02 NOTE — Hospital Course (Signed)
Dylan Cuevas is a 23 y.o M who presented with worsening sore throat and inability to swallow, found to have tonsillar abscess. No significant PMHx.   Tonsillar Abscess 2/2 Strep Pharyngitis Patient presented with 1 week of worsening sore throat despite tx with Z-pak. On presentation he was febrile to 101.5 and unable to swallow saliva, with trismus, hot potato voice and bilateral tonsillar erythema/exudates on exam. WBC 20.4 on admission, and CT revealed bulky tonsillitis with 4cm left tonsillar abscess and trace retropharyngeal effusion. ENT was consulted and patient was given Decadron x1 and started on IV Clindamycin 600mg  q6h. He was started on mIVF with LR due to inability to tolerate PO and given IV Toradol for pain control. Patient improved significantly and was tolerating PO by 9/***, at which time he was transitioned to PO Clindamycin and Ibuprofen liquid, and discharged home.

## 2019-10-02 NOTE — Discharge Instructions (Signed)
You were admitted to the hospital for a peritonsillar abscess, which is a potentially life-threatening complication of strep throat. While you were here we checked several tests including basic labs (blood counts, electrolytes) and CT imaging of your neck.  You were treated with IV pain medication, steroids, IV antibiotics and IV fluids until you were able to tolerate food, liquids, and medications by mouth.   You will need to continue your antibiotics for 13 more days (until 9/24). Take 1 pill 4 times per day. Diarrhea is a common side effect but should subside shortly after you finish the antibiotics.   If you develop any of the following, please seek medical care immediately: Difficulty breathing Inability to swallow Inability to open your mouth Changes in your voice >8 episodes of diarrhea per day

## 2019-10-02 NOTE — Discharge Summary (Addendum)
Family Medicine Teaching [facility] Discharge Summary  Patient name: Dylan Cuevas Medical record number: [id number] Date of birth: Apr 24, 1995 Age: 24 y.o. Gender: male Date of Admission: 10/01/2019  Date of Discharge: 10/02/2019 Admitting Physician: Lauro Franklin, MD  Primary Care Provider: Patient, No Pcp Per Consultants: ENT  Indication for Hospitalization: Tonsillar abscess  Discharge Diagnoses/Problem List:  Tonsillar abscess Strep pharyngitis  Disposition: Home  Discharge Condition: Stable, Improved  Discharge Exam:  General: alert, well-appearing, NAD HEENT: oropharynx with bilateral tonsillar edema, exudates and erythema. Voice slightly muffled Neck: Anterior cervical lymphadenopathy present Cardiovascular: RRR, normal S1/S2 without m/r/g Respiratory: normal WOB, lungs CTAB Abdomen: soft, nontender, nondistended Extremities: no peripheral edema  Brief Hospital Course:  Dylan Cuevas is a 24 y.o M who presented with worsening sore throat and inability to swallow, found to have tonsillar abscess. No significant PMHx.  Tonsillar Abscess 2/2 Strep Pharyngitis Patient presented with 1 week of worsening sore throat despite outpatient tx with Z-pak. On presentation he was febrile to 101.5 with difficulty managing secretions, trismus, hot potato voice and bilateral tonsillar erythema/exudates on exam. WBC 20.4 on admission, and CT revealed bulky tonsillitis with 4cm left tonsillar abscess and trace retropharyngeal effusion. ENT was consulted and felt medical management would be sufficient. He was given Decadron x1 and started on IV Clindamycin 600mg  q6h. He was started on mIVF with LR due to inability to tolerate PO and given IV Toradol for pain control. Patient improved significantly by hospital day 2 and was tolerating PO, at which time he was transitioned to PO Clindamycin and discharged home.   Issues for Follow Up:  1. Patient will need to complete 13 more days of  Clindamycin (14 day total course).  Significant Procedures: None  Significant Labs and Imaging:  Recent Labs  Lab 10/01/19 1222 10/02/19 0619  WBC 20.4* 22.1*  HGB 14.3 14.2  HCT 43.1 42.3  PLT 260 265   Recent Labs  Lab 10/01/19 1222 10/02/19 0619  NA 142 140  K 3.7 3.9  CL 102 101  CO2 27 28  GLUCOSE 104* 117*  BUN 8 10  CREATININE 0.90 0.71  CALCIUM 9.5 9.6   COVID neg HIV NR  CT Soft Tissue Neck W Contrast  Result Date: 10/01/2019 CLINICAL DATA:  24 year old male with strep throat, symptoms for 1 week now with difficulty breathing. EXAM: CT NECK WITH CONTRAST TECHNIQUE: Multidetector CT imaging of the neck was performed using the standard protocol following the bolus administration of intravenous contrast. CONTRAST:  21mL OMNIPAQUE IOHEXOL 300 MG/ML  SOLN COMPARISON:  None. FINDINGS: Pharynx and larynx: Negative larynx. Distended/capacious hypopharynx. Small volume of retained secretions in the hypopharynx. Bilateral parapharyngeal space inflammation, greater on the left. Trace retropharyngeal space fluid/effusion. Bilateral abnormal palatine tonsils. The left is larger, and demonstrates subtotal indistinct hypodensity within the tonsil (31 x 39 mm see series 3, image 45). The smaller right tonsil however also demonstrates indistinct internal low-density in an area of about 14 mm (same image). More mild in generalized adenoid hypertrophy. Salivary glands: Negative sublingual space. There is mild secondary inflammation in the posterior left submandibular space. The submandibular glands appear to remain normal. Negative parotid glands. Thyroid: Negative. Lymph nodes: Reactive appearing bilateral level 2 lymph nodes, individually up to 16 mm short axis. Small but increased bilateral retropharyngeal nodes are also evident. No cystic or necrotic nodes. Other bilateral lymph node stations have a more normal appearance. Vascular: Major vascular structures in the neck and at the skull base  are  patent and normal. The right IJ is dominant. The left vertebral artery may arise directly from the aortic arch (normal variant). Limited intracranial: Negative. Visualized orbits: Negative. Mastoids and visualized paranasal sinuses: Well pneumatized. There are scattered small mucous retention cysts in the maxillary sinuses. Tympanic cavities and mastoids are clear. Skeleton: No acute dental finding. No osseous abnormality identified. Upper chest: Negative. IMPRESSION: Bulky Acute Tonsillitis with suppuration of both palatine tonsils - worse on the left with a nearly 4 cm developing left tonsillar abscess. Associated inflammation in both parapharyngeal spaces and the left submandibular space. Trace retropharyngeal effusion. Reactive retropharyngeal and bilateral level 2 lymph nodes. Recommend ENT consultation. Electronically Signed   By: Odessa Fleming M.D.   On: 10/01/2019 18:52   Results/Tests Pending at Time of Discharge: None  Discharge Medications:  Allergies as of 10/02/2019      Reactions   Amoxicillin    Has patient had a PCN reaction causing immediate rash, facial/tongue/throat swelling, SOB or lightheadedness with hypotension: No Has patient had a PCN reaction causing severe rash involving mucus membranes or skin necrosis: No Has patient had a PCN reaction that required hospitalization: Yes Has patient had a PCN reaction occurring within the last 10 years: No If all of the above answers are "NO", then may proceed with Cephalosporin use.      Medication List    TAKE these medications   acetaminophen 325 MG tablet Commonly known as: TYLENOL Take 2 tablets (650 mg total) by mouth every 6 (six) hours as needed for mild pain or moderate pain (or Fever >/= 101).   clindamycin 300 MG capsule Commonly known as: CLEOCIN Take 1 capsule (300 mg total) by mouth 4 (four) times daily for 13 days.       Discharge Instructions: Please refer to Patient Instructions section of EMR for full details.   Patient was counseled important signs and symptoms that should prompt return to medical care, changes in medications, dietary instructions, activity restrictions, and follow up appointments.   Follow-Up Appointments:  Follow-up Information    Family doctor/Urgent Care. Schedule an appointment as soon as possible for a visit in 3 day(s).   Why: for follow up, sooner if worsening            Patient instructed to follow up with his PCP as needed. Strict return precautions discussed.  Maury Dus, MD 10/02/2019, 5:51 PM PGY-1, [facility]  Orpah Cobb, DO [facility], PGY3 10/04/2019 2:35 PM

## 2022-02-27 IMAGING — CT CT NECK W/ CM
5 of 6 series · 14 of 33 positions shown, 16 images · IV contrast (APPLIED)
Comparison: None.

CLINICAL DATA: 24-year-old male with strep throat, symptoms for 1
week now with difficulty breathing.

EXAM:
CT NECK WITH CONTRAST
TECHNIQUE: Multidetector CT imaging of the neck was performed using the
standard protocol following the bolus administration of intravenous
contrast.
CONTRAST:  75mL OMNIPAQUE IOHEXOL 300 MG/ML  SOLN

[Series 3: axial neck · axial · 0.60mm/px · z∈[+859,+991]mm · 3 of 134 slices shown, 4 images]
[im 34/134  soft-tissue]
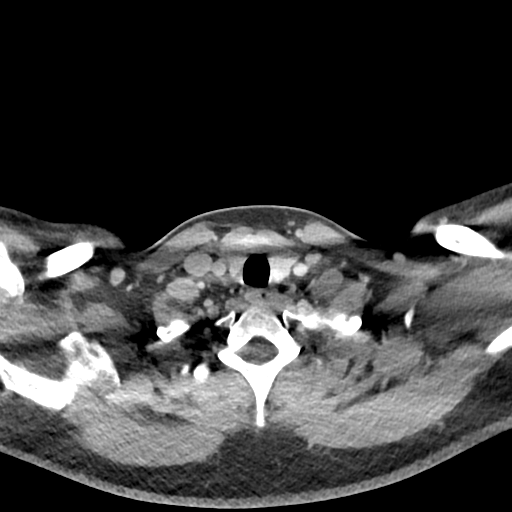
[im 34/134  bone]
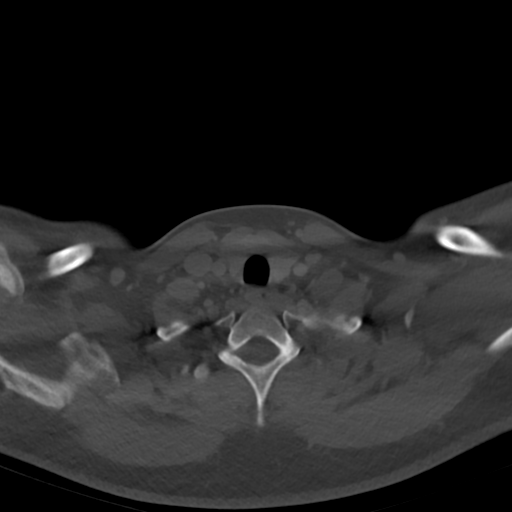
[im 67/134  bone]
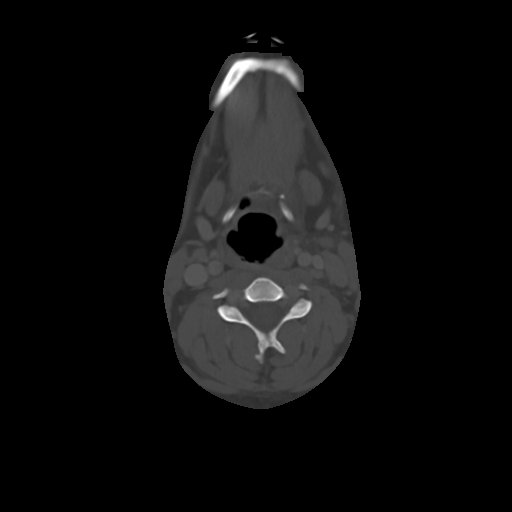
[im 100/134  bone]
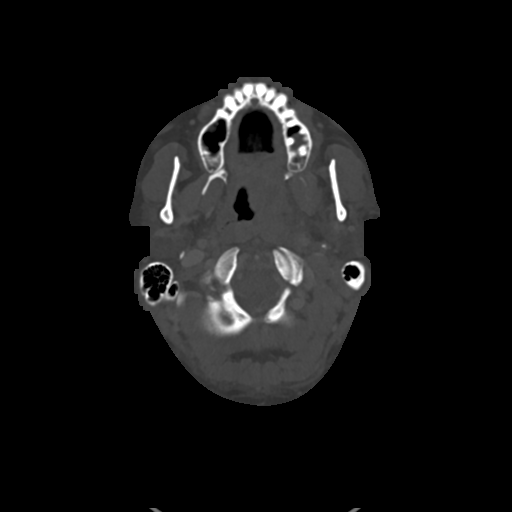

[Series 5: axial bone · axial · 0.60mm/px · z∈[+881,+969]mm · 2 of 134 slices shown]
[im 45/134  bone]
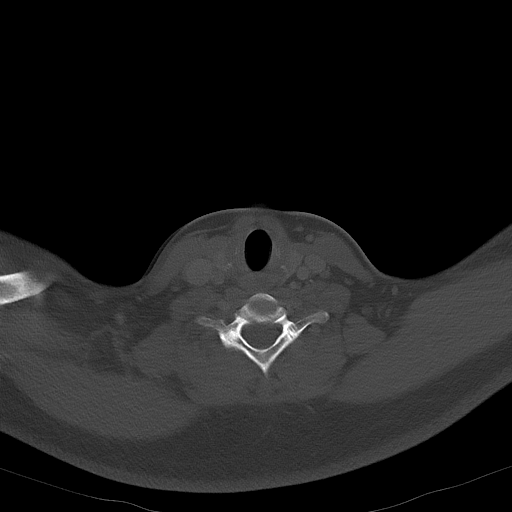
[im 89/134  bone]
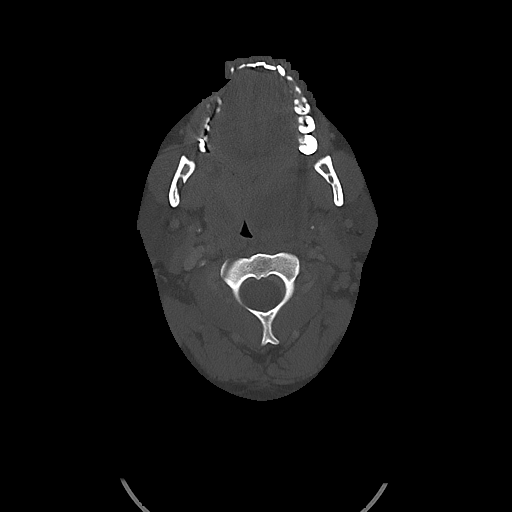

[Series 6: sag neck · sagittal · 0.53mm/px · 5 of 92 slices shown, 6 images]
[im 31/92  bone]
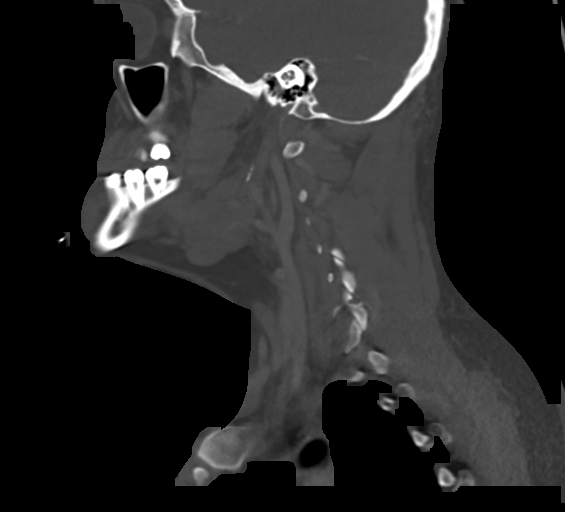
[im 38/92  bone]
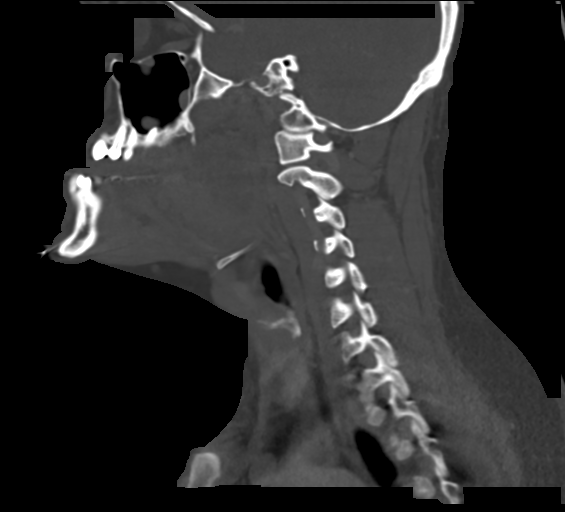
[im 46/92  soft-tissue]
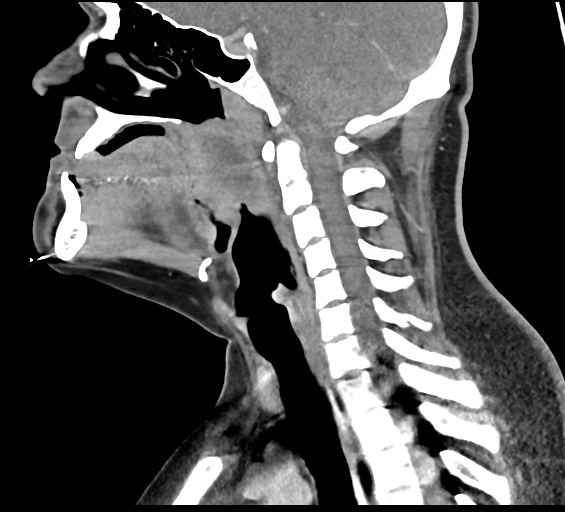
[im 46/92  bone]
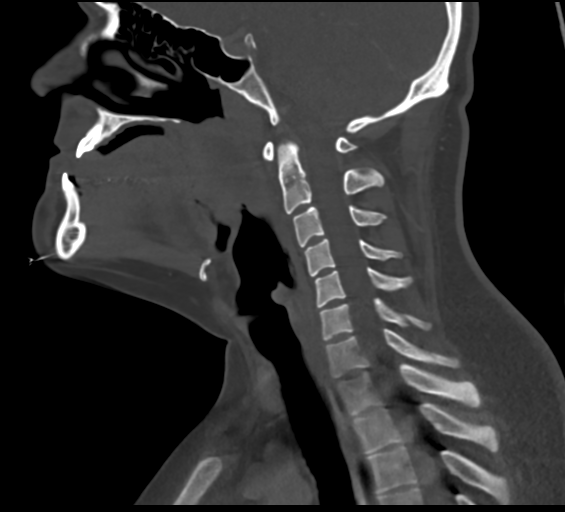
[im 54/92  bone]
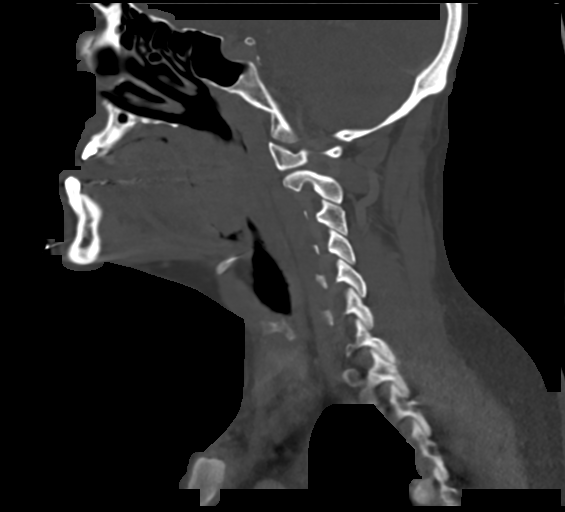
[im 61/92  bone]
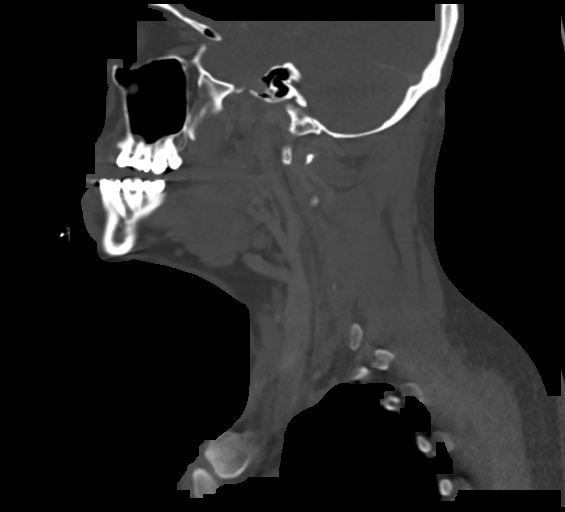

[Series 7: cor neck · coronal · 0.43mm/px · 3 of 151 slices shown]
[im 31/151  bone]
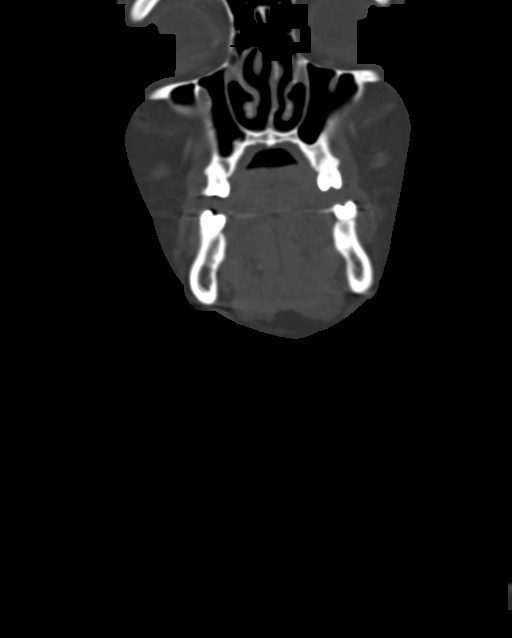
[im 61/151  bone]
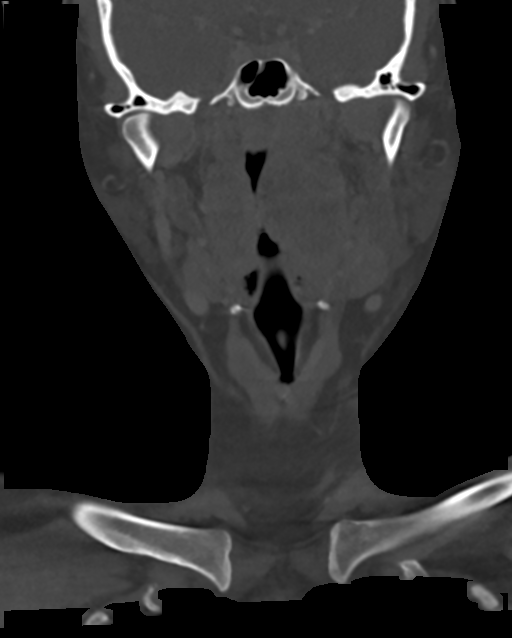
[im 91/151  bone]
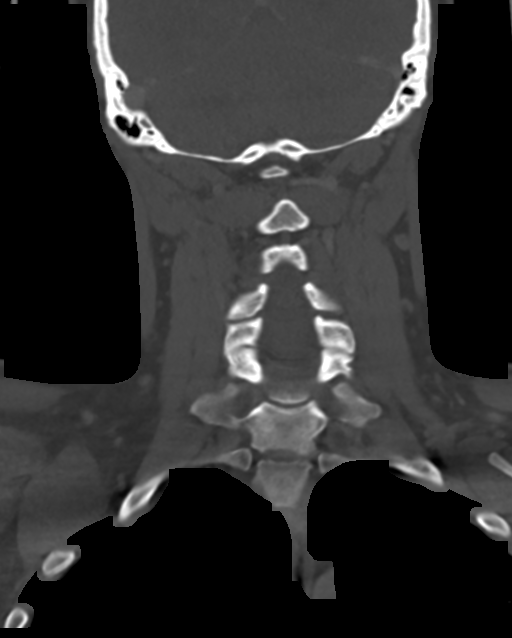

[Series 8: ax oropharynx · axial · 0.45mm/px · 1 of 134 slices shown]
[im 45/134  bone]
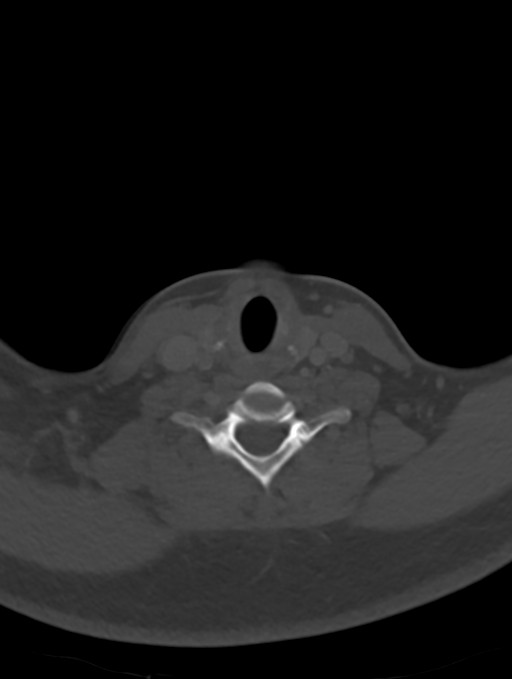

[14 of 33 positions shown; findings below may reference images not displayed]

FINDINGS: Pharynx and larynx: Negative larynx.

Distended/capacious hypopharynx. Small volume of retained secretions
in the hypopharynx.

Bilateral parapharyngeal space inflammation, greater on the left.
Trace retropharyngeal space fluid/effusion.

Bilateral abnormal palatine tonsils. The left is larger, and
demonstrates subtotal indistinct hypodensity within the tonsil (31 x
39 mm see series 3, image 45). The smaller right tonsil however also
demonstrates indistinct internal low-density in an area of about 14
mm (same image).

More mild in generalized adenoid hypertrophy.

Salivary glands: Negative sublingual space. There is mild secondary
inflammation in the posterior left submandibular space. The
submandibular glands appear to remain normal. Negative parotid
glands.

Thyroid: Negative.

Lymph nodes: Reactive appearing bilateral level 2 lymph nodes,
individually up to 16 mm short axis. Small but increased bilateral
retropharyngeal nodes are also evident. No cystic or necrotic nodes.

Other bilateral lymph node stations have a more normal appearance.

Vascular: Major vascular structures in the neck and at the skull
base are patent and normal. The right IJ is dominant. The left
vertebral artery may arise directly from the aortic arch (normal
variant).

Limited intracranial: Negative.

Visualized orbits: Negative.

Mastoids and visualized paranasal sinuses: Well pneumatized. There
are scattered small mucous retention cysts in the maxillary sinuses.
Tympanic cavities and mastoids are clear.

Skeleton: No acute dental finding. No osseous abnormality
identified.

Upper chest: Negative.
IMPRESSION: Bulky Acute Tonsillitis with suppuration of both palatine tonsils -
worse on the left with a nearly 4 cm developing left tonsillar
abscess.
Associated inflammation in both parapharyngeal spaces and the left
submandibular space. Trace retropharyngeal effusion.
Reactive retropharyngeal and bilateral level 2 lymph nodes.
Recommend ENT consultation.

## 2022-03-22 ENCOUNTER — Inpatient Hospital Stay (HOSPITAL_COMMUNITY)
Admission: AD | Admit: 2022-03-22 | Payer: Commercial Managed Care - PPO | Source: Intra-hospital | Admitting: Psychiatry
